# Patient Record
Sex: Male | Born: 1990 | Race: Black or African American | Hispanic: No | Marital: Single | Smoking: Current every day smoker
Health system: Southern US, Community
[De-identification: ages and names within clinical notes are randomized; demographics above are authoritative.]

---

## 2016-04-29 ENCOUNTER — Emergency Department (HOSPITAL_BASED_OUTPATIENT_CLINIC_OR_DEPARTMENT_OTHER): Payer: Self-pay

## 2016-04-29 ENCOUNTER — Encounter (HOSPITAL_BASED_OUTPATIENT_CLINIC_OR_DEPARTMENT_OTHER): Payer: Self-pay

## 2016-04-29 ENCOUNTER — Emergency Department (HOSPITAL_BASED_OUTPATIENT_CLINIC_OR_DEPARTMENT_OTHER)
Admission: EM | Admit: 2016-04-29 | Discharge: 2016-04-29 | Disposition: A | Discharge status: Home / Self Care | Payer: Self-pay | Attending: Emergency Medicine | Admitting: Emergency Medicine

## 2016-04-29 DIAGNOSIS — M25521 Pain in right elbow: Secondary | ICD-10-CM | POA: Insufficient documentation

## 2016-04-29 DIAGNOSIS — M79641 Pain in right hand: Secondary | ICD-10-CM | POA: Insufficient documentation

## 2016-04-29 DIAGNOSIS — F17200 Nicotine dependence, unspecified, uncomplicated: Secondary | ICD-10-CM | POA: Insufficient documentation

## 2016-04-29 MED ORDER — NAPROXEN 500 MG PO TABS: 500.0000 mg | ORAL_TABLET | Freq: Two times a day (BID) | ORAL | Status: DC

## 2016-04-29 NOTE — ED Provider Notes (Signed)
CSN: [id number]     Arrival date & time 04/29/16  1239 History   First MD Initiated Contact with Patient 04/29/16 1320     Chief Complaint  Patient presents with  . Hand Injury     (Consider location/radiation/quality/duration/timing/severity/associated sxs/prior Treatment) HPI  Pt presents with pain in right hand and right elbow after punching a wall 2 nights ago.  Pt states the pain has been constant, worse with movement and palpation.  He states immediately after hitting the wall the pain also began in right elbow.  He has not had any treatment prior to arrival.  Some swelling of right elbow and hand associated.  There are no other associated systemic symptoms, there are no other alleviating or modifying factors.   History reviewed. No pertinent past medical history. History reviewed. No pertinent past surgical history. No family history on file. Social History  Substance Use Topics  . Smoking status: Current Every Day Smoker  . Smokeless tobacco: None  . Alcohol Use: No    Review of Systems  ROS reviewed and all otherwise negative except for mentioned in HPI    Allergies  Review of patient's allergies indicates no known allergies.  Home Medications   Prior to Admission medications   Medication Sig Start Date End Date Taking? Authorizing Provider  naproxen (NAPROSYN) 500 MG tablet Take 1 tablet (500 mg total) by mouth 2 (two) times daily. 04/29/16   Jerelyn Scott, MD   BP 104/57 mmHg  Pulse 95  Temp(Src) 98.8 F (37.1 C) (Oral)  Resp 18  Ht  (1.778 m)  Wt 144 lb (65.318 kg)  BMI 20.66 kg/m2  SpO2 99%  Vitals reviewed Physical Exam  Physical Examination: General appearance - alert, well appearing, and in no distress Mental status - alert, oriented to person, place, and time Eyes - no conjunctival injection, no scleral icterus Chest - clear to auscultation, no wheezes, rales or rhonchi, symmetric air entry Heart - normal rate, regular rhythm, normal S1, S2, no  murmurs, rubs, clicks or gallops Neurological - alert, oriented, normal speech, sensation and strength intact in right upper extremity Musculoskeletal - ttp diffusely over dorsum of right hand, no snuffbox tenderness, ttp over proximal radius and olecranon process, otherwise no joint tenderness, deformity or swelling Extremities - peripheral pulses normal, no pedal edema, no clubbing or cyanosis Skin - normal coloration and turgor, no rashes  ED Course  Procedures (including critical care time) Labs Review Labs Reviewed - No data to display  Imaging Review Dg Elbow Complete Right  04/29/2016  CLINICAL DATA:  Hit a wall 2 days ago, medial elbow pain EXAM: RIGHT ELBOW - COMPLETE 3+ VIEW COMPARISON:  None. FINDINGS: Four views of the right elbow submitted. No acute fracture or subluxation. No radiopaque foreign body. No posterior fat pad sign. IMPRESSION: Negative. Electronically Signed   By: Natasha Mead M.D.   On: 04/29/2016 14:02   Dg Hand Complete Right  04/29/2016  CLINICAL DATA:  Medial right hand pain. EXAM: RIGHT HAND - COMPLETE 3+ VIEW COMPARISON:  None. FINDINGS: There is no evidence of fracture or dislocation. There is no evidence of arthropathy or other focal bone abnormality. Soft tissues are unremarkable. IMPRESSION: Negative. Electronically Signed   By: Elige Ko   On: 04/29/2016 13:02   I have personally reviewed and evaluated these images and lab results as part of my medical decision-making.   EKG Interpretation None      MDM   Final diagnoses:  Hand pain,  right  Elbow pain, right    Pt presenting with pain in right hand and right elbow after punching  Wall.  xrays are reassuring- no fracture identified.  Hand is distally NVI.  Pt treated with naproxen and given information for hand followup if pain worsens or continues.  Discharged with strict return precautions.  Pt agreeable with plan.    Jerelyn ScottMartha Linker, MD 04/29/16 [phone]

## 2016-04-29 NOTE — Discharge Instructions (Signed)
Return to the ED with any concerns including increased pain, swelling/numbness/discoloration of hand or fingers, decreased level of alertness/lethargy, or any other alarming symptoms

## 2016-04-29 NOTE — ED Notes (Signed)
Punched a wall 2 days ago-pain /swelling right hand-NAD-steady gait

## 2018-03-21 ENCOUNTER — Emergency Department (HOSPITAL_BASED_OUTPATIENT_CLINIC_OR_DEPARTMENT_OTHER)
Admission: EM | Admit: 2018-03-21 | Discharge: 2018-03-21 | Disposition: A | Discharge status: Home / Self Care | Payer: Self-pay | Attending: Emergency Medicine | Admitting: Emergency Medicine

## 2018-03-21 ENCOUNTER — Other Ambulatory Visit: Payer: Self-pay

## 2018-03-21 DIAGNOSIS — R112 Nausea with vomiting, unspecified: Secondary | ICD-10-CM | POA: Insufficient documentation

## 2018-03-21 DIAGNOSIS — R197 Diarrhea, unspecified: Secondary | ICD-10-CM | POA: Insufficient documentation

## 2018-03-21 DIAGNOSIS — R61 Generalized hyperhidrosis: Secondary | ICD-10-CM | POA: Insufficient documentation

## 2018-03-21 DIAGNOSIS — F17210 Nicotine dependence, cigarettes, uncomplicated: Secondary | ICD-10-CM | POA: Insufficient documentation

## 2018-03-21 DIAGNOSIS — R1084 Generalized abdominal pain: Secondary | ICD-10-CM | POA: Insufficient documentation

## 2018-03-21 LAB — CBC WITH DIFFERENTIAL/PLATELET
BASOS ABS: 0 10*3/uL (ref 0.0–0.1)
BASOS PCT: 0 %
EOS ABS: 0 10*3/uL (ref 0.0–0.7)
EOS PCT: 0 %
HEMATOCRIT: 48.9 % (ref 39.0–52.0)
Hemoglobin: 16.6 g/dL (ref 13.0–17.0)
Lymphocytes Relative: 2 %
Lymphs Abs: 0.2 10*3/uL — ABNORMAL LOW (ref 0.7–4.0)
MCH: 32.5 pg (ref 26.0–34.0)
MCHC: 33.9 g/dL (ref 30.0–36.0)
MCV: 95.9 fL (ref 78.0–100.0)
MONO ABS: 0.4 10*3/uL (ref 0.1–1.0)
Monocytes Relative: 4 %
NEUTROS ABS: 10.2 10*3/uL — AB (ref 1.7–7.7)
Neutrophils Relative %: 94 %
PLATELETS: 160 10*3/uL (ref 150–400)
RBC: 5.1 MIL/uL (ref 4.22–5.81)
RDW: 12.5 % (ref 11.5–15.5)
WBC: 10.9 10*3/uL — ABNORMAL HIGH (ref 4.0–10.5)

## 2018-03-21 LAB — COMPREHENSIVE METABOLIC PANEL
ALBUMIN: 4.9 g/dL (ref 3.5–5.0)
ALK PHOS: 86 U/L (ref 38–126)
ALT: 32 U/L (ref 17–63)
AST: 59 U/L — ABNORMAL HIGH (ref 15–41)
Anion gap: 10 (ref 5–15)
BILIRUBIN TOTAL: 1.1 mg/dL (ref 0.3–1.2)
BUN: 18 mg/dL (ref 6–20)
CALCIUM: 9.8 mg/dL (ref 8.9–10.3)
CO2: 23 mmol/L (ref 22–32)
Chloride: 105 mmol/L (ref 101–111)
Creatinine, Ser: 1.25 mg/dL — ABNORMAL HIGH (ref 0.61–1.24)
GFR calc Af Amer: >60 mL/min (ref >60)
GFR calc non Af Amer: >60 mL/min (ref >60)
GLUCOSE: 99 mg/dL (ref 65–99)
Potassium: 3.8 mmol/L (ref 3.5–5.1)
Sodium: 138 mmol/L (ref 135–145)
TOTAL PROTEIN: 8.4 g/dL — AB (ref 6.5–8.1)

## 2018-03-21 LAB — URINALYSIS, ROUTINE W REFLEX MICROSCOPIC
GLUCOSE, UA: NEGATIVE mg/dL
Hgb urine dipstick: NEGATIVE
KETONES UR: 15 mg/dL — AB
LEUKOCYTES UA: NEGATIVE
NITRITE: NEGATIVE
PROTEIN: 30 mg/dL — AB
Specific Gravity, Urine: 1.02 (ref 1.005–1.030)
pH: 6 (ref 5.0–8.0)

## 2018-03-21 LAB — URINALYSIS, MICROSCOPIC (REFLEX)
RBC / HPF: NONE SEEN RBC/hpf (ref 0–5)
Squamous Epithelial / LPF: NONE SEEN

## 2018-03-21 LAB — LIPASE, BLOOD: LIPASE: 24 U/L (ref 11–51)

## 2018-03-21 MED ORDER — LOPERAMIDE HCL 2 MG PO CAPS: 2.0000 mg | ORAL_CAPSULE | Freq: Four times a day (QID) | ORAL | 0 refills | Status: DC | PRN

## 2018-03-21 MED ORDER — DICYCLOMINE HCL 20 MG PO TABS: 20.0000 mg | ORAL_TABLET | Freq: Two times a day (BID) | ORAL | 0 refills | Status: DC

## 2018-03-21 MED ORDER — SODIUM CHLORIDE 0.9 % IV BOLUS (SEPSIS)
1000.0000 mL | Freq: Once | INTRAVENOUS | Status: AC
  Administered 2018-03-21: 1000 mL via INTRAVENOUS

## 2018-03-21 MED ORDER — SUCRALFATE 1 GM/10ML PO SUSP: 1.0000 g | Freq: Three times a day (TID) | ORAL | 0 refills | Status: DC

## 2018-03-21 MED ORDER — ONDANSETRON 4 MG PO TBDP: 4.0000 mg | ORAL_TABLET | Freq: Three times a day (TID) | ORAL | 0 refills | Status: DC | PRN

## 2018-03-21 MED ORDER — DICYCLOMINE HCL 10 MG PO CAPS
10.0000 mg | ORAL_CAPSULE | Freq: Once | ORAL | Status: AC
  Administered 2018-03-21: 10 mg via ORAL
  Filled 2018-03-21: qty 1

## 2018-03-21 MED ORDER — ONDANSETRON HCL 4 MG/2ML IJ SOLN
4.0000 mg | Freq: Once | INTRAMUSCULAR | Status: AC
  Administered 2018-03-21: 4 mg via INTRAVENOUS
  Filled 2018-03-21: qty 2

## 2018-03-21 NOTE — ED Provider Notes (Signed)
Emergency Department Provider Note   I have reviewed the triage vital signs and the nursing notes.   HISTORY  Chief Complaint Flank Pain   HPI Omar Hanson is a 27 y.o. male presents to the emergency department for evaluation of generalized abdominal discomfort with nausea, vomiting, diarrhea.  The patient has had body aches and some diaphoresis.  No known sick contacts.  No blood in the diarrhea or vomit.  Unclear if the patient has had fevers.  He states he is concerned regarding kidney stones but has not had these in the past. No radiation or symptoms. Symptoms worse with trying to eat/drink.     No past surgical history on file.  Current Outpatient Rx  . Order #: [id number] Class: Print  . Order #: [id number] Class: Print  . Order #: [id number] Class: Print  . Order #: [id number] Class: Print  . Order #: [id number] Class: Print    Allergies Patient has no known allergies.  No family history on file.  Social History Social History   Tobacco Use  . Smoking status: Current Every Day Smoker  Substance Use Topics  . Alcohol use: No  . Drug use: No    Review of Systems  Constitutional: No fever/chills. Positive fatigue and diaphoresis.  Eyes: No visual changes. ENT: No sore throat. Cardiovascular: Denies chest pain. Respiratory: Denies shortness of breath. Gastrointestinal: Positive diffuse abdominal pain. Positive nausea, vomiting, and diarrhea.  No constipation. Genitourinary: Negative for dysuria. Musculoskeletal: Negative for back pain. Skin: Negative for rash. Neurological: Negative for focal weakness or numbness. Positive HA.   10-point ROS otherwise negative.  ____________________________________________   PHYSICAL EXAM:  VITAL SIGNS: ED Triage Vitals  Enc Vitals Group     BP 03/21/18 2042 124/68     Pulse Rate 03/21/18 2042 99     Resp 03/21/18 2042 (!) 22     Temp 03/21/18 2042 100.2 F (37.9 C)     Temp Source 03/21/18 2042 Oral     SpO2  03/21/18 2042 100 %     Weight 03/21/18 2043 164 lb (74.4 kg)     Height 03/21/18 2043 5\' 11"  (1.803 m)     Pain Score 03/21/18 2042 8   Constitutional: Alert and oriented. Well appearing and in no acute distress. Eyes: Conjunctivae are normal.  Head: Atraumatic. Nose: No congestion/rhinnorhea. Mouth/Throat: Mucous membranes are dry.  Neck: No stridor. Cardiovascular: Normal rate, regular rhythm. Good peripheral circulation. Grossly normal heart sounds.   Respiratory: Normal respiratory effort.  No retractions. Lungs CTAB. Gastrointestinal: Soft with mild diffuse tenderness. No distention.  Musculoskeletal: No lower extremity tenderness nor edema. No gross deformities of extremities. Neurologic:  Normal speech and language. No gross focal neurologic deficits are appreciated.  Skin:  Skin is warm, dry and intact. No rash noted.   ____________________________________________   LABS (all labs ordered are listed, but only abnormal results are displayed)  Labs Reviewed  URINALYSIS, ROUTINE W REFLEX MICROSCOPIC - Abnormal; Notable for the following components:      Result Value   Bilirubin Urine SMALL (*)    Ketones, ur 15 (*)    Protein, ur 30 (*)    All other components within normal limits  URINALYSIS, MICROSCOPIC (REFLEX) - Abnormal; Notable for the following components:   Bacteria, UA FEW (*)    All other components within normal limits  COMPREHENSIVE METABOLIC PANEL - Abnormal; Notable for the following components:   Creatinine, Ser 1.25 (*)    Total Protein 8.4 (*)    AST 59 (*)  All other components within normal limits  CBC WITH DIFFERENTIAL/PLATELET - Abnormal; Notable for the following components:   WBC 10.9 (*)    Neutro Abs 10.2 (*)    Lymphs Abs 0.2 (*)    All other components within normal limits  LIPASE, BLOOD   ____________________________________________   PROCEDURES  Procedure(s) performed:    Procedures  None ____________________________________________   INITIAL IMPRESSION / ASSESSMENT AND PLAN / ED COURSE  Pertinent labs & imaging results that were available during my care of the patient were reviewed by me and considered in my medical decision making (see chart for details).  Patient presents to the ED with symptoms of viral GI illness. Mild diffuse abdominal tenderness. No findings to suggest kidney stone. UA from triage is negative for infection or Hb. Patient appears moderately dehydrated. Plan for IVF and labs.   Patient feeling much better after IVF. Tolerating PO. Labs reviewed with no findings.   At this time, I do not feel there is any life-threatening condition present. I have reviewed and discussed all results (EKG, imaging, lab, urine as appropriate), exam findings with patient. I have reviewed nursing notes and appropriate previous records.  I feel the patient is safe to be discharged home without further emergent workup. Discussed usual and customary return precautions. Patient and family (if present) verbalize understanding and are comfortable with this plan.  Patient will follow-up with their primary care provider. If they do not have a primary care provider, information for follow-up has been provided to them. All questions have been answered.  ____________________________________________  FINAL CLINICAL IMPRESSION(S) / ED DIAGNOSES  Final diagnoses:  Non-intractable vomiting with nausea, unspecified vomiting type  Generalized abdominal pain     MEDICATIONS GIVEN DURING THIS VISIT:  Medications  sodium chloride 0.9 % bolus 1,000 mL (0 mLs Intravenous Stopped 03/21/18 2208)  ondansetron (ZOFRAN) injection 4 mg (4 mg Intravenous Given 03/21/18 2147)  dicyclomine (BENTYL) capsule 10 mg (10 mg Oral Given 03/21/18 2156)     NEW OUTPATIENT MEDICATIONS STARTED DURING THIS VISIT:  Discharge Medication List as of 03/21/2018 11:00 PM    START taking these  medications   Details  dicyclomine (BENTYL) 20 MG tablet Take 1 tablet (20 mg total) by mouth 2 (two) times daily., Starting Sun 03/21/2018, Print    loperamide (IMODIUM) 2 MG capsule Take 1 capsule (2 mg total) by mouth 4 (four) times daily as needed for diarrhea or loose stools., Starting Sun 03/21/2018, Print    ondansetron (ZOFRAN ODT) 4 MG disintegrating tablet Take 1 tablet (4 mg total) by mouth every 8 (eight) hours as needed for nausea or vomiting., Starting Sun 03/21/2018, Print    sucralfate (CARAFATE) 1 GM/10ML suspension Take 10 mLs (1 g total) by mouth 4 (four) times daily -  with meals and at bedtime., Starting Sun 03/21/2018, Print        Note:  This document was prepared using Dragon voice recognition software and may include unintentional dictation errors.  Alona BeneJoshua Mukhtar Shams, MD Emergency Medicine    Madsen Riddle, Arlyss RepressJoshua G, MD 03/22/18 1011

## 2018-03-21 NOTE — Discharge Instructions (Signed)

## 2018-03-21 NOTE — ED Triage Notes (Signed)
Patient states that he has had bilateral flank pain and generalized abdominal pain since 12 today with N/V/D

## 2019-02-19 ENCOUNTER — Other Ambulatory Visit: Payer: Self-pay

## 2019-02-19 ENCOUNTER — Emergency Department (HOSPITAL_BASED_OUTPATIENT_CLINIC_OR_DEPARTMENT_OTHER): Payer: Self-pay

## 2019-02-19 ENCOUNTER — Encounter (HOSPITAL_BASED_OUTPATIENT_CLINIC_OR_DEPARTMENT_OTHER): Payer: Self-pay | Admitting: Emergency Medicine

## 2019-02-19 ENCOUNTER — Emergency Department (HOSPITAL_BASED_OUTPATIENT_CLINIC_OR_DEPARTMENT_OTHER)
Admission: EM | Admit: 2019-02-19 | Discharge: 2019-02-20 | Disposition: A | Discharge status: Home / Self Care | Payer: Self-pay | Attending: Emergency Medicine | Admitting: Emergency Medicine

## 2019-02-19 DIAGNOSIS — J111 Influenza due to unidentified influenza virus with other respiratory manifestations: Secondary | ICD-10-CM | POA: Insufficient documentation

## 2019-02-19 DIAGNOSIS — R05 Cough: Secondary | ICD-10-CM | POA: Insufficient documentation

## 2019-02-19 DIAGNOSIS — Z79899 Other long term (current) drug therapy: Secondary | ICD-10-CM | POA: Insufficient documentation

## 2019-02-19 DIAGNOSIS — R69 Illness, unspecified: Secondary | ICD-10-CM

## 2019-02-19 DIAGNOSIS — F17210 Nicotine dependence, cigarettes, uncomplicated: Secondary | ICD-10-CM | POA: Insufficient documentation

## 2019-02-19 MED ORDER — IPRATROPIUM-ALBUTEROL 0.5-2.5 (3) MG/3ML IN SOLN
3.0000 mL | RESPIRATORY_TRACT | Status: DC
  Administered 2019-02-20: 3 mL via RESPIRATORY_TRACT
  Filled 2019-02-19: qty 3

## 2019-02-19 MED ORDER — KETOROLAC TROMETHAMINE 60 MG/2ML IM SOLN
60.0000 mg | Freq: Once | INTRAMUSCULAR | Status: AC
  Administered 2019-02-20: 60 mg via INTRAMUSCULAR
  Filled 2019-02-19: qty 2

## 2019-02-19 MED ORDER — OSELTAMIVIR PHOSPHATE 75 MG PO CAPS
75.0000 mg | ORAL_CAPSULE | Freq: Once | ORAL | Status: AC
  Administered 2019-02-20: 75 mg via ORAL
  Filled 2019-02-19: qty 1

## 2019-02-19 MED ORDER — ONDANSETRON 4 MG PO TBDP
4.0000 mg | ORAL_TABLET | Freq: Once | ORAL | Status: AC
  Administered 2019-02-20: 4 mg via ORAL
  Filled 2019-02-19: qty 1

## 2019-02-19 NOTE — ED Triage Notes (Signed)
Patient states that he has had generalized aches and headache all day

## 2019-02-19 NOTE — ED Provider Notes (Signed)
Emergency Department Provider Note   I have reviewed the triage vital signs and the nursing notes.   HISTORY  Chief Complaint Fever   HPI Omar Hanson is a 28 y.o. male without significant past medical history who did not get a flu shot this year the presents the emergency department today with headache, body aches, fever, chills, diaphoresis associated fever.  Aspirin helped some but then came back.  Patient states he had no known sick contacts.  Is had somewhat of a cough.  He does smoke.  No focal pain just generally feeling achy and uncomfortable.  Has not seen 1 for the symptoms.  Started when he woke up this morning. No other associated or modifying symptoms.    History reviewed. No pertinent past medical history.  There are no active problems to display for this patient.   History reviewed. No pertinent surgical history.  Current Outpatient Rx  . Order #: [id number] Class: Print  . Order #: [id number] Class: Print  . Order #: [id number] Class: Print  . Order #: [id number] Class: Print  . Order #: [id number] Class: Print  . Order #: [id number] Class: Print    Allergies Patient has no known allergies.  History reviewed. No pertinent family history.  Social History Social History   Tobacco Use  . Smoking status: Current Every Day Smoker  . Smokeless tobacco: Never Used  Substance Use Topics  . Alcohol use: No  . Drug use: No    Review of Systems  All other systems negative except as documented in the HPI. All pertinent positives and negatives as reviewed in the HPI. ____________________________________________   PHYSICAL EXAM:  VITAL SIGNS: ED Triage Vitals  Enc Vitals Group     BP 02/19/19 2234 124/87     Pulse Rate 02/19/19 2234 97     Resp 02/19/19 2234 20     Temp 02/19/19 2234 (!) 100.8 F (38.2 C)     Temp Source 02/19/19 2234 Oral     SpO2 02/19/19 2234 100 %     Weight 02/19/19 2233 160 lb (72.6 kg)     Height 02/19/19 2233 5\' 10"  (1.778 m)     Constitutional: Alert and oriented. Well appearing and in no acute distress. Eyes: Conjunctivae are normal. PERRL. EOMI. Head: Atraumatic. Nose: No congestion/rhinnorhea. Mouth/Throat: Mucous membranes are moist.  Oropharynx non-erythematous. Neck: No stridor.  No meningeal signs.   Cardiovascular: Normal rate, regular rhythm. Good peripheral circulation. Grossly normal heart sounds.   Respiratory: Normal respiratory effort.  No retractions. Lungs CTAB. Gastrointestinal: Soft and nontender. No distention.  Musculoskeletal: No lower extremity tenderness nor edema. No gross deformities of extremities. Neurologic:  Normal speech and language. No gross focal neurologic deficits are appreciated.  Skin:  Skin is warm, dry and intact. No rash noted.  ____________________________________________   RADIOLOGY  Dg Chest 2 View  Result Date: 02/19/2019 CLINICAL DATA:  28 y/o  M; eval for cough/fever. EXAM: CHEST - 2 VIEW COMPARISON:  None. FINDINGS: The heart size and mediastinal contours are within normal limits. Both lungs are clear. Minimal S-shaped curvature of the spine. No acute osseous abnormality is evident. IMPRESSION: No acute pulmonary process identified. Electronically Signed   By: Mitzi Hansen M.D.   On: 02/19/2019 23:34    ____________________________________________   PROCEDURES  Procedure(s) performed:   Procedures   ____________________________________________   INITIAL IMPRESSION / ASSESSMENT AND PLAN / ED COURSE  Influenza like illness. Will treat supportively.   Improved symptoms with supportive care.  Improved fever.  Patient  feels better.  Will continue supportive care at home.     Pertinent labs & imaging results that were available during my care of the patient were reviewed by me and considered in my medical decision making (see chart for details).  ____________________________________________  FINAL CLINICAL IMPRESSION(S) / ED  DIAGNOSES  Final diagnoses:  Influenza-like illness     MEDICATIONS GIVEN DURING THIS VISIT:  Medications  ketorolac (TORADOL) injection 60 mg (60 mg Intramuscular Given 02/20/19 0004)  oseltamivir (TAMIFLU) capsule 75 mg (75 mg Oral Given 02/20/19 0005)  ondansetron (ZOFRAN-ODT) disintegrating tablet 4 mg (4 mg Oral Given 02/20/19 0005)     NEW OUTPATIENT MEDICATIONS STARTED DURING THIS VISIT:  Discharge Medication List as of 02/20/2019  1:08 AM    START taking these medications   Details  oseltamivir (TAMIFLU) 75 MG capsule Take 1 capsule (75 mg total) by mouth every 12 (twelve) hours., Starting Sun 02/20/2019, Print        Note:  This note was prepared with assistance of Dragon voice recognition software. Occasional wrong-word or sound-a-like substitutions may have occurred due to the inherent limitations of voice recognition software.   Moksh Loomer, Barbara Cower, MD 02/20/19 [phone]

## 2019-02-20 MED ORDER — ONDANSETRON 4 MG PO TBDP: 4.0000 mg | ORAL_TABLET | Freq: Three times a day (TID) | ORAL | 0 refills | Status: DC | PRN

## 2019-02-20 MED ORDER — OSELTAMIVIR PHOSPHATE 75 MG PO CAPS: 75.0000 mg | ORAL_CAPSULE | Freq: Two times a day (BID) | ORAL | 0 refills | Status: DC

## 2019-03-19 IMAGING — CR DG CHEST 2V
2 series · 2 of 2 positions shown · non-contrast
Comparison: None.

CLINICAL DATA: 27 y/o  M; eval for cough/fever.

EXAM:
CHEST - 2 VIEW

[w chest pa]
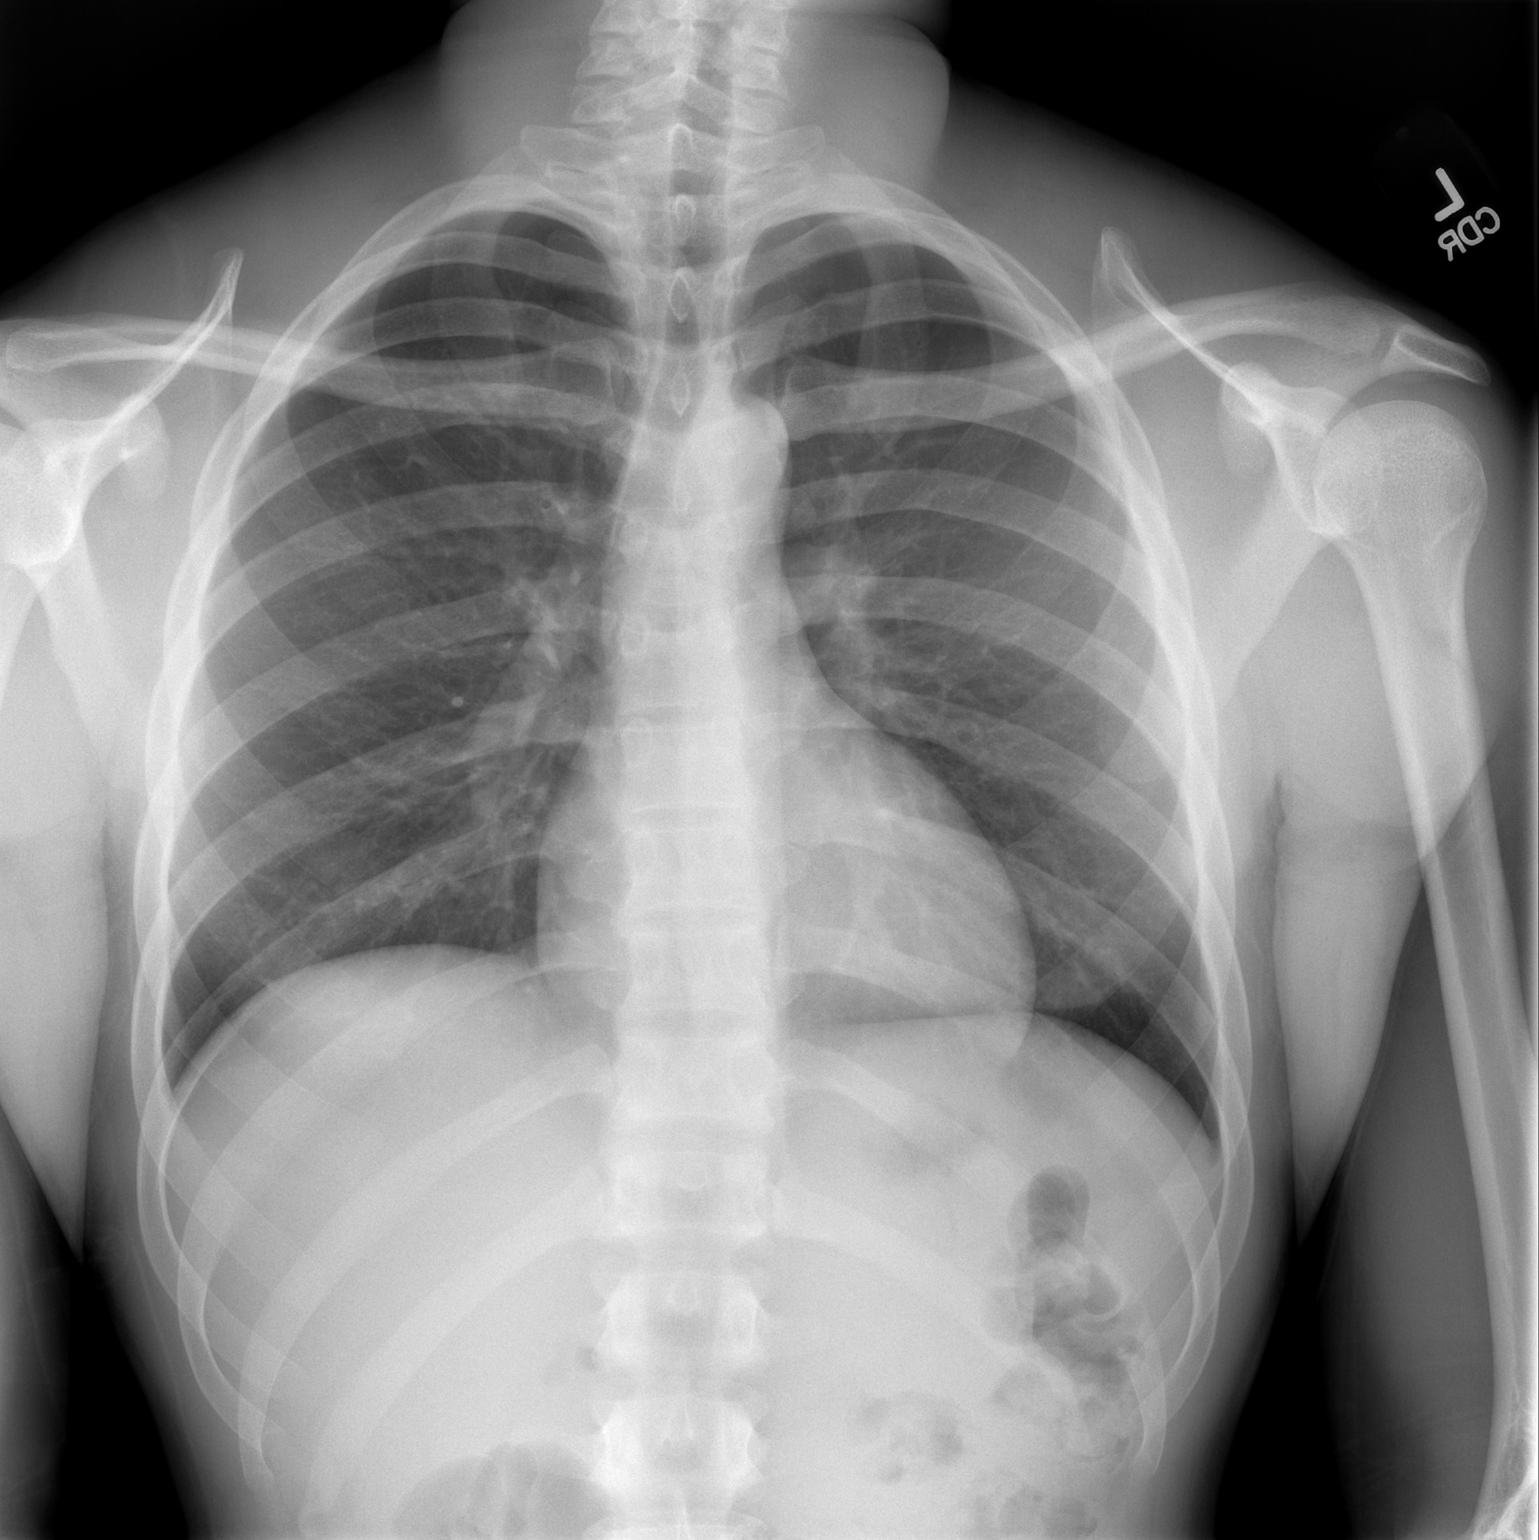

[w chest lat]
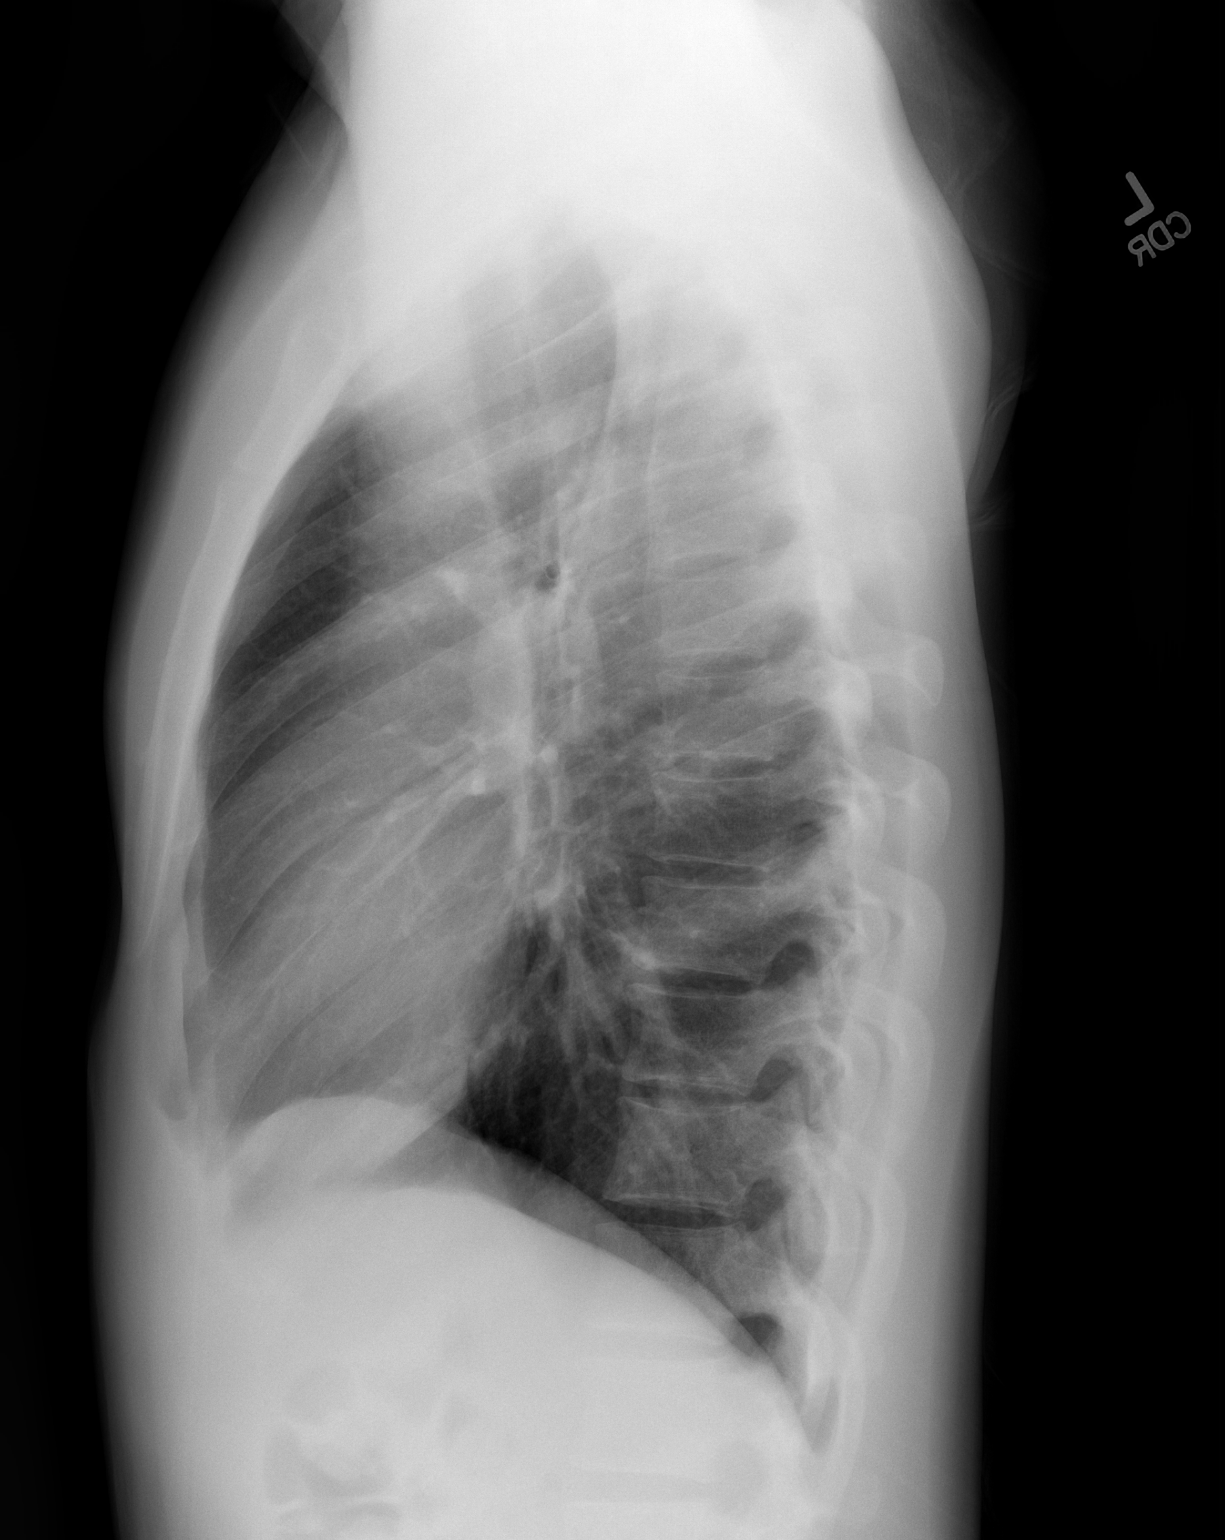

[2 of 2 positions shown; findings below may reference images not displayed]

FINDINGS: The heart size and mediastinal contours are within normal limits.
Both lungs are clear. Minimal S-shaped curvature of the spine. No
acute osseous abnormality is evident.
IMPRESSION: No acute pulmonary process identified.

## 2019-12-07 ENCOUNTER — Other Ambulatory Visit: Payer: Self-pay

## 2019-12-07 ENCOUNTER — Emergency Department (HOSPITAL_COMMUNITY)
Admission: EM | Admit: 2019-12-07 | Discharge: 2019-12-07 | Disposition: A | Discharge status: Home / Self Care | Payer: Self-pay | Attending: Emergency Medicine | Admitting: Emergency Medicine

## 2019-12-07 ENCOUNTER — Emergency Department (HOSPITAL_COMMUNITY): Payer: Self-pay

## 2019-12-07 ENCOUNTER — Encounter (HOSPITAL_COMMUNITY): Payer: Self-pay | Admitting: Emergency Medicine

## 2019-12-07 DIAGNOSIS — Z79899 Other long term (current) drug therapy: Secondary | ICD-10-CM | POA: Insufficient documentation

## 2019-12-07 DIAGNOSIS — F17200 Nicotine dependence, unspecified, uncomplicated: Secondary | ICD-10-CM | POA: Insufficient documentation

## 2019-12-07 DIAGNOSIS — Y9389 Activity, other specified: Secondary | ICD-10-CM | POA: Insufficient documentation

## 2019-12-07 DIAGNOSIS — W260XXA Contact with knife, initial encounter: Secondary | ICD-10-CM | POA: Insufficient documentation

## 2019-12-07 DIAGNOSIS — Y92010 Kitchen of single-family (private) house as the place of occurrence of the external cause: Secondary | ICD-10-CM | POA: Insufficient documentation

## 2019-12-07 DIAGNOSIS — S61511A Laceration without foreign body of right wrist, initial encounter: Secondary | ICD-10-CM | POA: Insufficient documentation

## 2019-12-07 DIAGNOSIS — Y998 Other external cause status: Secondary | ICD-10-CM | POA: Insufficient documentation

## 2019-12-07 MED ORDER — BACITRACIN ZINC 500 UNIT/GM EX OINT
TOPICAL_OINTMENT | Freq: Two times a day (BID) | CUTANEOUS | Status: DC
  Filled 2019-12-07: qty 1.8

## 2019-12-07 MED ORDER — ACETAMINOPHEN 325 MG PO TABS
650.0000 mg | ORAL_TABLET | Freq: Once | ORAL | Status: AC
  Administered 2019-12-07: 650 mg via ORAL
  Filled 2019-12-07: qty 2

## 2019-12-07 MED ORDER — LIDOCAINE HCL (PF) 1 % IJ SOLN
10.0000 mL | Freq: Once | INTRAMUSCULAR | Status: AC
Start: 2019-12-07 — End: 2019-12-07
  Administered 2019-12-07: 10 mL
  Filled 2019-12-07: qty 30

## 2019-12-07 NOTE — ED Triage Notes (Signed)
Patient here with [facility] with complaints of hand laceration. States "its a long story". Bleeding controlled not on any blood thinners.
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
## 2019-12-07 NOTE — Discharge Instructions (Addendum)
You were seen in the emergency department today for a laceration. Your laceration was closed with 6 stitches. Please keep this area clean and dry for the next 24 hours, after 24 hours you may get this area wet, but avoid soaking the area. Keep the area covered as best possible especially when in the sun to help in minimizing scarring.   Please wear the wrist brace for the first few days to avoid irritation of this area.   You will need to have the stitches removed and the wound rechecked in 8-10 days. Please return to the emergency department, go to an urgent care, or see your primary care provider to have this performed. Return to the ER soon should you start to experience pus type drainage from the wound, redness around the wound, or fevers as this could indicate the area is infected, please return to the ER for any other worsening symptoms or concerns that you may have.

## 2020-01-04 IMAGING — CR DG WRIST COMPLETE 3+V*R*
4 series · 4 of 4 positions shown · non-contrast
Comparison: None.

CLINICAL DATA: Right hand laceration.

EXAM:
RIGHT WRIST - COMPLETE 3+ VIEW

[x wrist pa right]
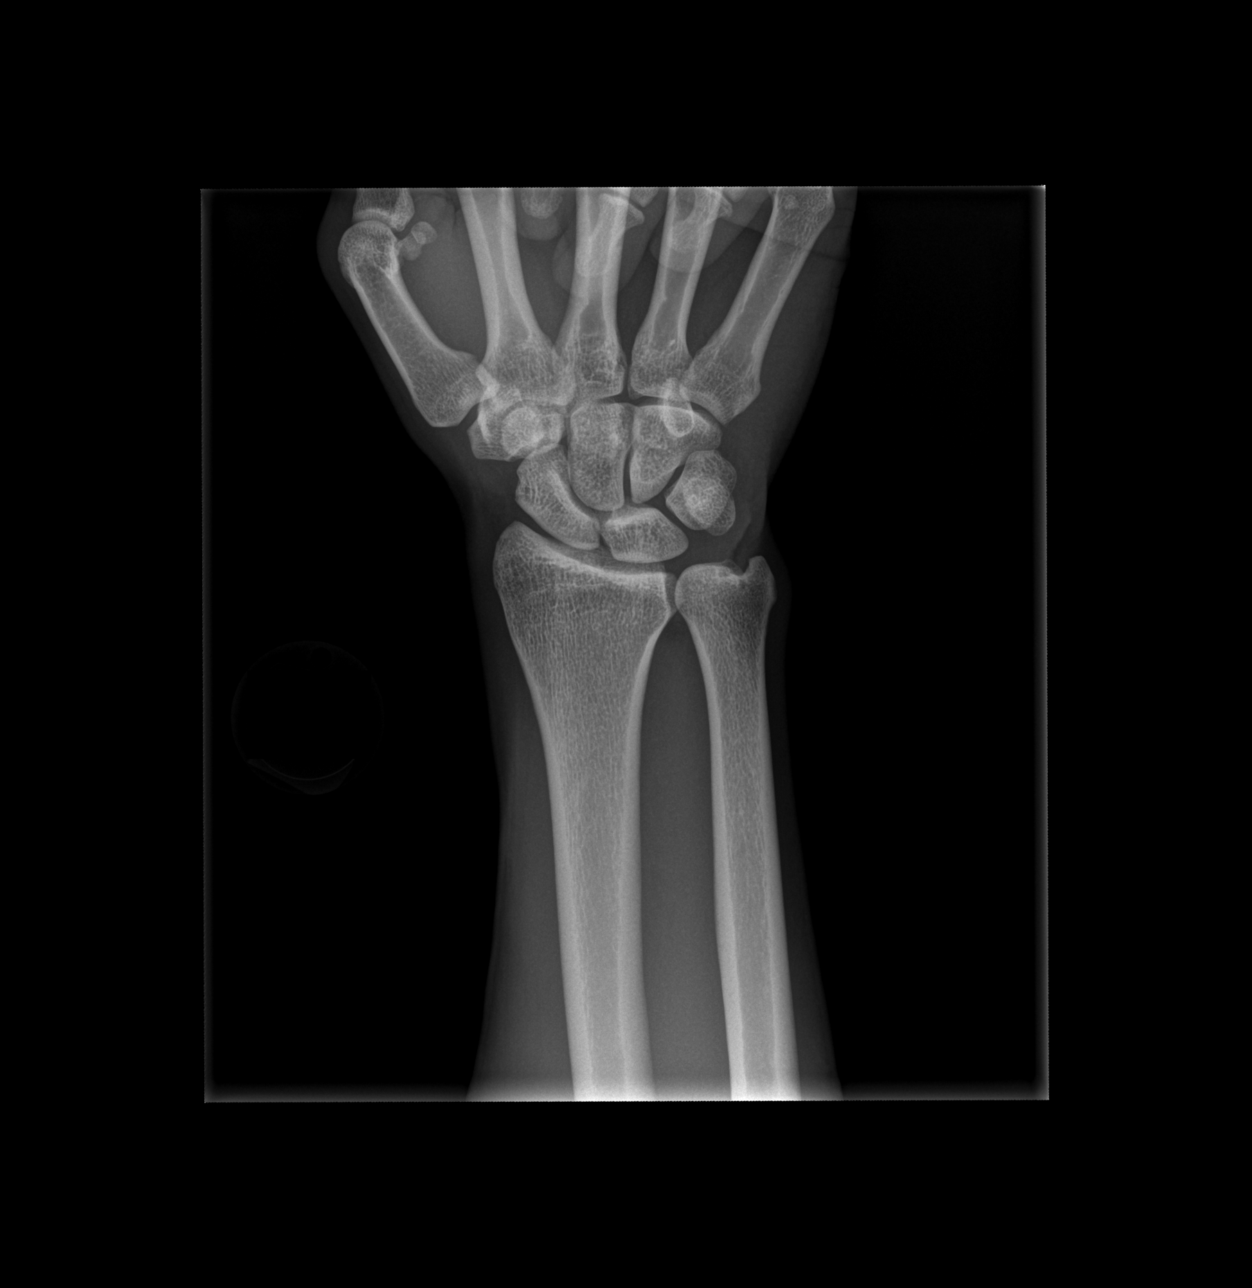

[x wrist lat right]
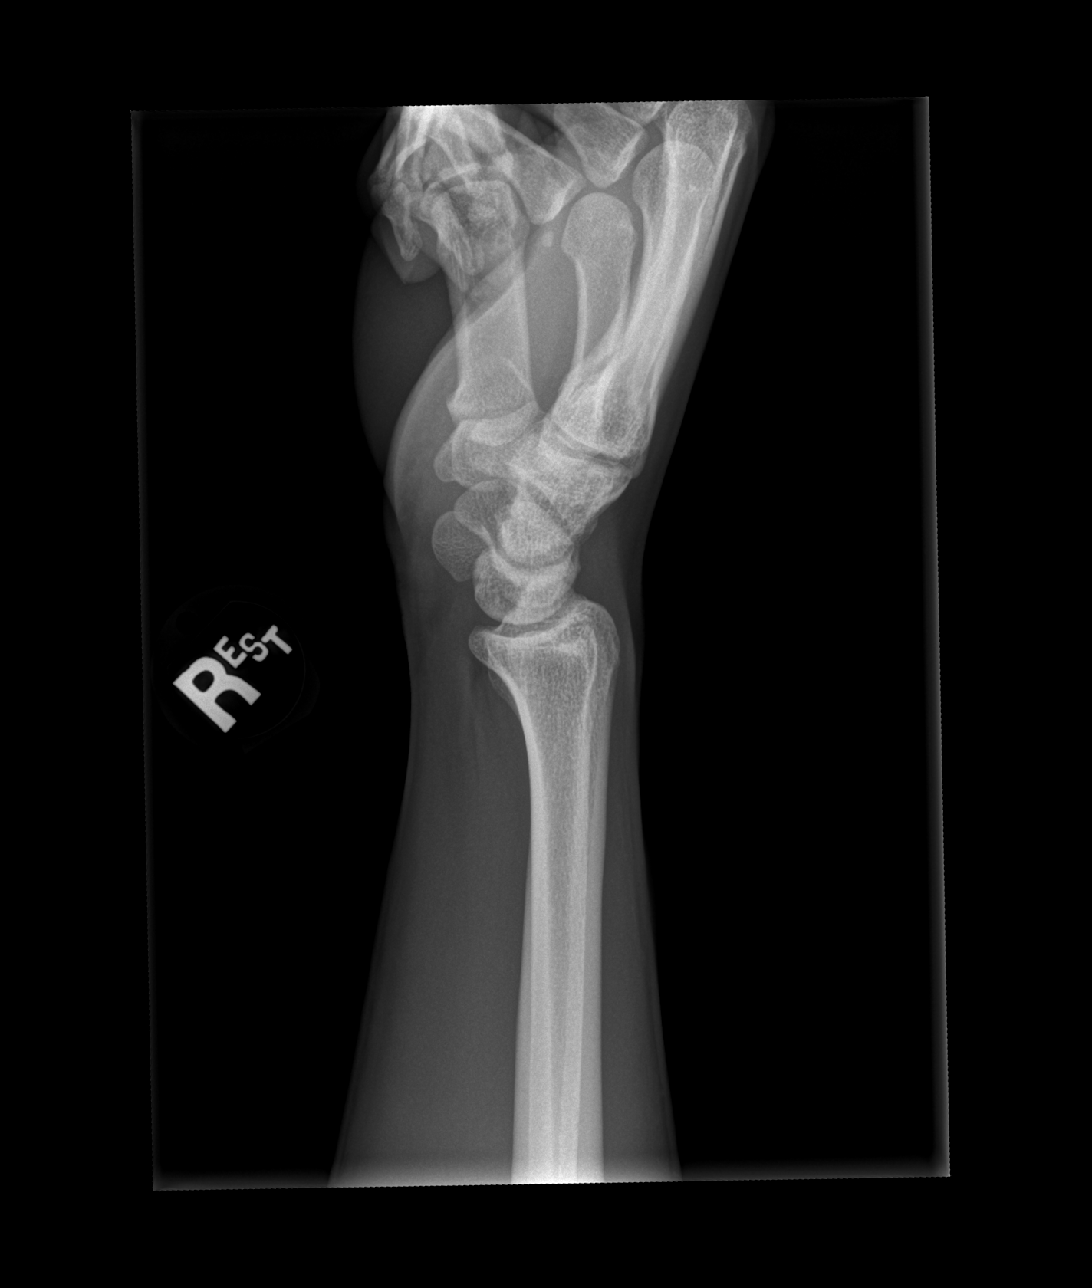

[x wrist obl right]
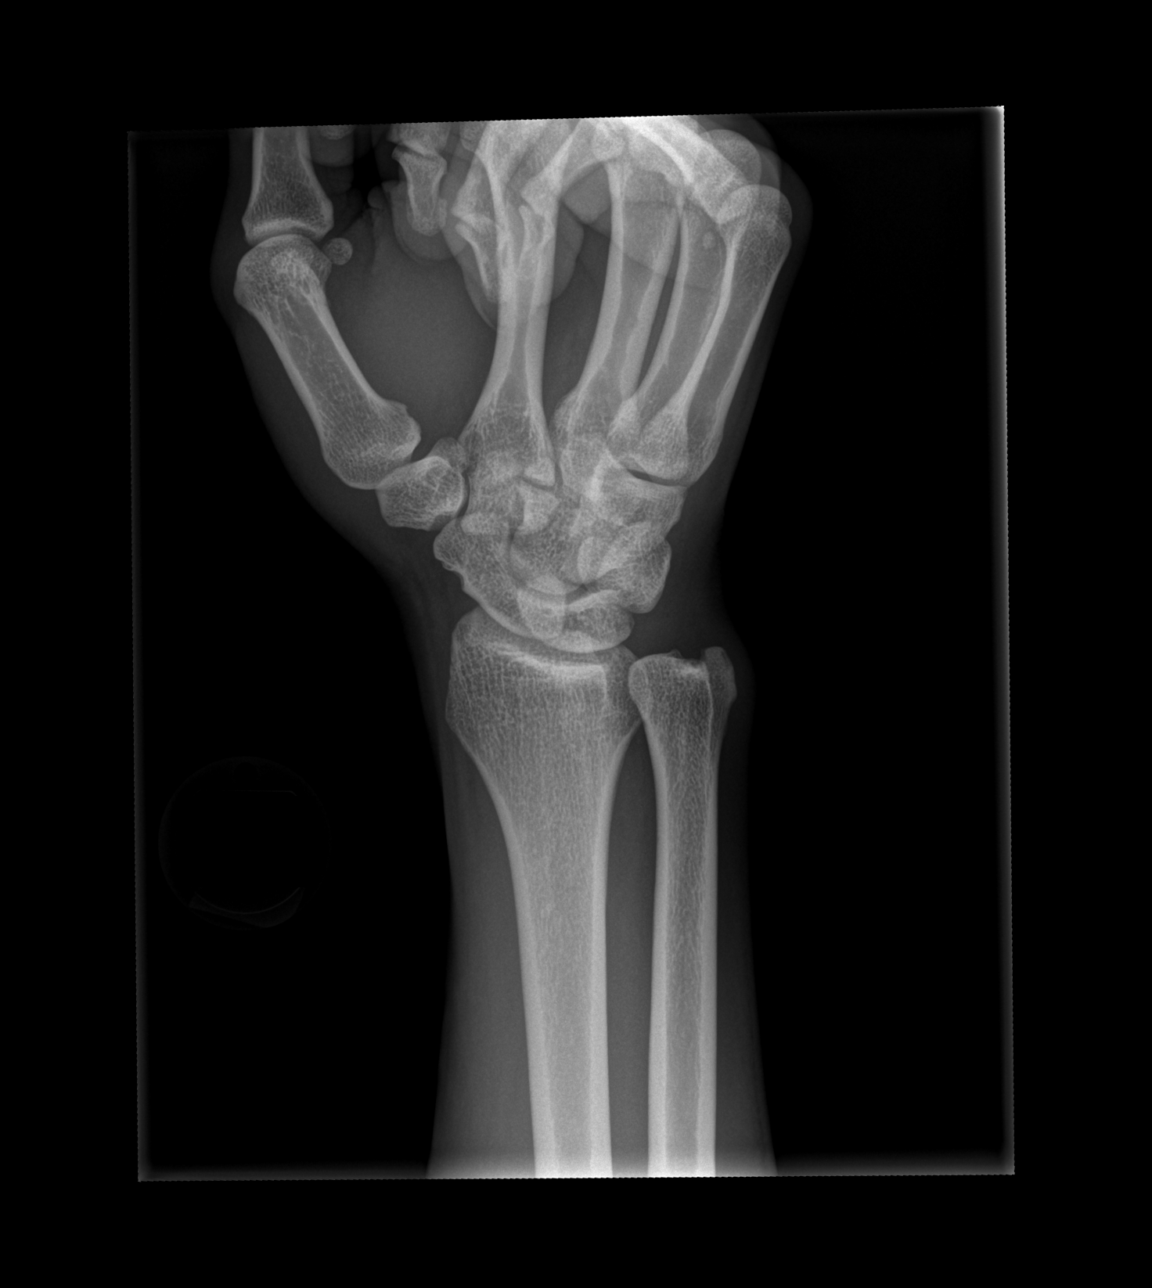

[x wrist navicular view right]
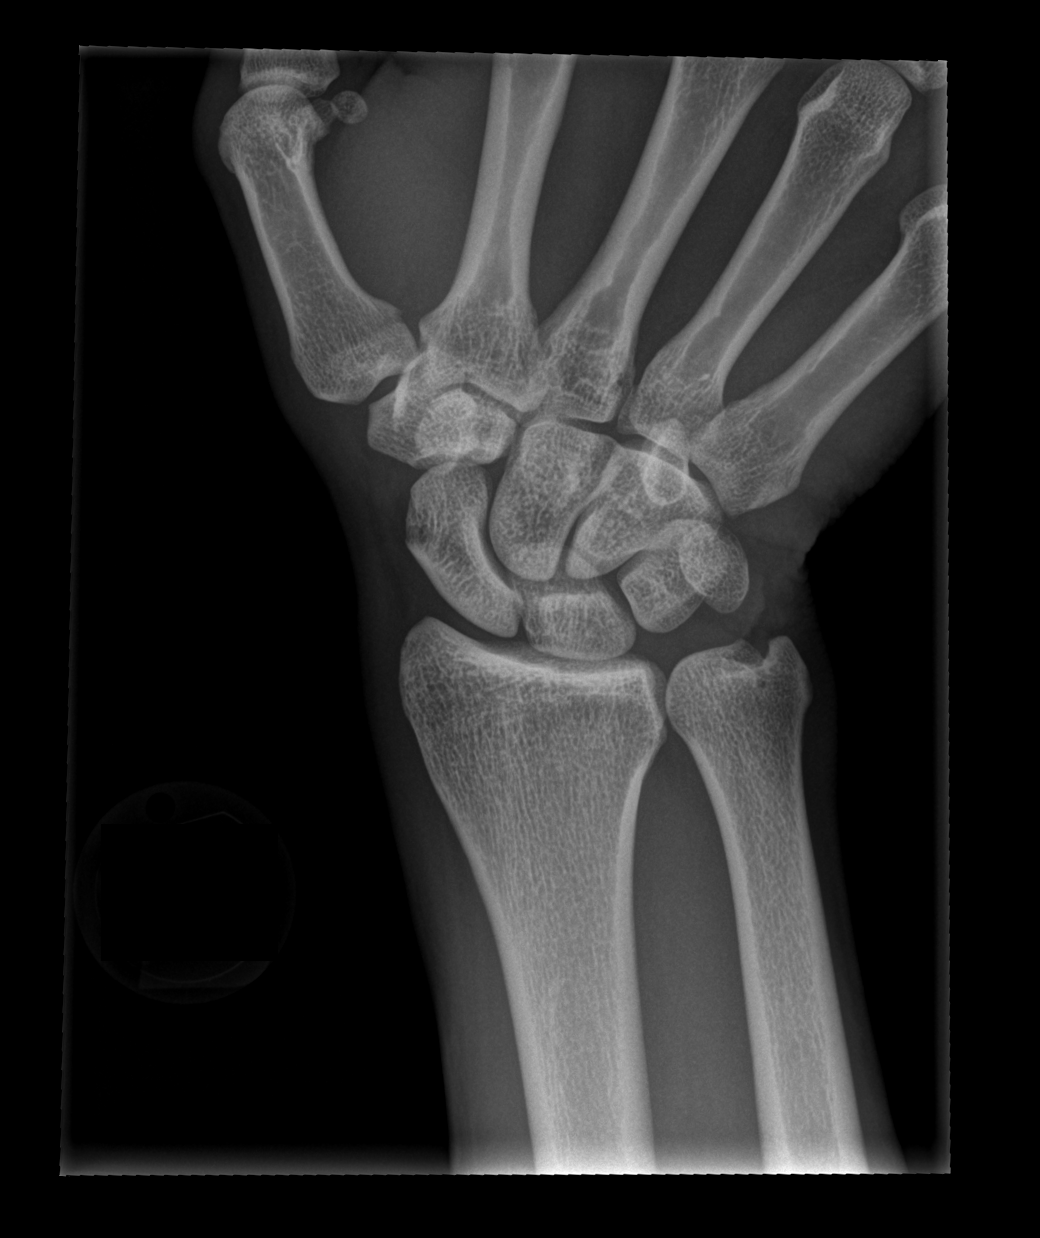

[4 of 4 positions shown; findings below may reference images not displayed]

FINDINGS: There is no evidence of fracture or dislocation. There is no
evidence of arthropathy or other focal bone abnormality. Soft
tissues are unremarkable.
IMPRESSION: Negative.

## 2020-01-18 ENCOUNTER — Emergency Department (HOSPITAL_BASED_OUTPATIENT_CLINIC_OR_DEPARTMENT_OTHER)
Admission: EM | Admit: 2020-01-18 | Discharge: 2020-01-19 | Disposition: A | Discharge status: Home / Self Care | Payer: Self-pay | Attending: Emergency Medicine | Admitting: Emergency Medicine

## 2020-01-18 ENCOUNTER — Encounter (HOSPITAL_BASED_OUTPATIENT_CLINIC_OR_DEPARTMENT_OTHER): Payer: Self-pay | Admitting: Emergency Medicine

## 2020-01-18 ENCOUNTER — Other Ambulatory Visit: Payer: Self-pay

## 2020-01-18 DIAGNOSIS — F17200 Nicotine dependence, unspecified, uncomplicated: Secondary | ICD-10-CM | POA: Insufficient documentation

## 2020-01-18 DIAGNOSIS — S61511D Laceration without foreign body of right wrist, subsequent encounter: Secondary | ICD-10-CM | POA: Insufficient documentation

## 2020-01-18 DIAGNOSIS — L089 Local infection of the skin and subcutaneous tissue, unspecified: Secondary | ICD-10-CM | POA: Insufficient documentation

## 2020-01-18 NOTE — ED Triage Notes (Signed)
Pt has sutures to right wrist placed 12/9 and states he took sutures out at home and now has pain at site. Bandage in place.

## 2020-01-19 MED ORDER — CEPHALEXIN 500 MG PO CAPS: 500.0000 mg | ORAL_CAPSULE | Freq: Four times a day (QID) | ORAL | 0 refills | Status: AC

## 2020-01-19 MED ORDER — CEPHALEXIN 250 MG PO CAPS
ORAL_CAPSULE | ORAL | Status: AC
  Filled 2020-01-19: qty 2

## 2020-01-19 MED ORDER — CEPHALEXIN 250 MG PO CAPS
500.0000 mg | ORAL_CAPSULE | Freq: Once | ORAL | Status: AC
  Administered 2020-01-19: 500 mg via ORAL

## 2020-01-19 NOTE — ED Notes (Signed)
ED Provider at bedside. 
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
## 2020-01-19 NOTE — Discharge Instructions (Signed)
Begin taking Keflex as prescribed.  Warm compresses as frequently as possible for the next several days.
